# Patient Record
Sex: Female | Born: 1980 | Race: White | Hispanic: No | Smoking: Never smoker
Health system: Southern US, Community
[De-identification: ages and names within clinical notes are randomized; demographics above are authoritative.]

## PROBLEM LIST (undated history)

## (undated) HISTORY — PX: OTHER SURGICAL HISTORY: SHX169

---

## 2021-04-30 ENCOUNTER — Other Ambulatory Visit (HOSPITAL_BASED_OUTPATIENT_CLINIC_OR_DEPARTMENT_OTHER): Payer: Self-pay

## 2021-04-30 ENCOUNTER — Other Ambulatory Visit: Payer: Self-pay

## 2021-04-30 ENCOUNTER — Emergency Department (HOSPITAL_BASED_OUTPATIENT_CLINIC_OR_DEPARTMENT_OTHER)
Admission: EM | Admit: 2021-04-30 | Discharge: 2021-04-30 | Disposition: A | Discharge status: Home / Self Care | Payer: 59 | Attending: Emergency Medicine | Admitting: Emergency Medicine

## 2021-04-30 ENCOUNTER — Emergency Department (HOSPITAL_BASED_OUTPATIENT_CLINIC_OR_DEPARTMENT_OTHER): Payer: 59

## 2021-04-30 ENCOUNTER — Encounter (HOSPITAL_BASED_OUTPATIENT_CLINIC_OR_DEPARTMENT_OTHER): Payer: Self-pay | Admitting: Emergency Medicine

## 2021-04-30 DIAGNOSIS — R1032 Left lower quadrant pain: Secondary | ICD-10-CM | POA: Diagnosis present

## 2021-04-30 DIAGNOSIS — R319 Hematuria, unspecified: Secondary | ICD-10-CM | POA: Diagnosis not present

## 2021-04-30 DIAGNOSIS — R110 Nausea: Secondary | ICD-10-CM | POA: Diagnosis not present

## 2021-04-30 DIAGNOSIS — R109 Unspecified abdominal pain: Secondary | ICD-10-CM

## 2021-04-30 DIAGNOSIS — M549 Dorsalgia, unspecified: Secondary | ICD-10-CM | POA: Insufficient documentation

## 2021-04-30 LAB — URINALYSIS, ROUTINE W REFLEX MICROSCOPIC
Bilirubin Urine: NEGATIVE
Glucose, UA: NEGATIVE mg/dL
Hgb urine dipstick: NEGATIVE
Ketones, ur: NEGATIVE mg/dL
Leukocytes,Ua: NEGATIVE
Nitrite: NEGATIVE
Protein, ur: NEGATIVE mg/dL
Specific Gravity, Urine: 1.025 (ref 1.005–1.030)
pH: 6 (ref 5.0–8.0)

## 2021-04-30 LAB — CBC WITH DIFFERENTIAL/PLATELET
Abs Immature Granulocytes: 0.03 10*3/uL (ref 0.00–0.07)
Basophils Absolute: 0 10*3/uL (ref 0.0–0.1)
Basophils Relative: 0 %
Eosinophils Absolute: 0.2 10*3/uL (ref 0.0–0.5)
Eosinophils Relative: 3 %
HCT: 42.8 % (ref 36.0–46.0)
Hemoglobin: 14.3 g/dL (ref 12.0–15.0)
Immature Granulocytes: 0 %
Lymphocytes Relative: 38 %
Lymphs Abs: 2.8 10*3/uL (ref 0.7–4.0)
MCH: 32.7 pg (ref 26.0–34.0)
MCHC: 33.4 g/dL (ref 30.0–36.0)
MCV: 97.9 fL (ref 80.0–100.0)
Monocytes Absolute: 0.7 10*3/uL (ref 0.1–1.0)
Monocytes Relative: 10 %
Neutro Abs: 3.6 10*3/uL (ref 1.7–7.7)
Neutrophils Relative %: 49 %
Platelets: 321 10*3/uL (ref 150–400)
RBC: 4.37 MIL/uL (ref 3.87–5.11)
RDW: 12.2 % (ref 11.5–15.5)
WBC: 7.4 10*3/uL (ref 4.0–10.5)
nRBC: 0 % (ref 0.0–0.2)

## 2021-04-30 LAB — COMPREHENSIVE METABOLIC PANEL
ALT: 17 U/L (ref 0–44)
AST: 18 U/L (ref 15–41)
Albumin: 3.8 g/dL (ref 3.5–5.0)
Alkaline Phosphatase: 36 U/L — ABNORMAL LOW (ref 38–126)
Anion gap: 8 (ref 5–15)
BUN: 14 mg/dL (ref 6–20)
CO2: 24 mmol/L (ref 22–32)
Calcium: 8.6 mg/dL — ABNORMAL LOW (ref 8.9–10.3)
Chloride: 105 mmol/L (ref 98–111)
Creatinine, Ser: 0.66 mg/dL (ref 0.44–1.00)
GFR, Estimated: >60 mL/min (ref >60)
Glucose, Bld: 88 mg/dL (ref 70–99)
Potassium: 3.8 mmol/L (ref 3.5–5.1)
Sodium: 137 mmol/L (ref 135–145)
Total Bilirubin: 0.4 mg/dL (ref 0.3–1.2)
Total Protein: 6.4 g/dL — ABNORMAL LOW (ref 6.5–8.1)

## 2021-04-30 LAB — PREGNANCY, URINE: Preg Test, Ur: NEGATIVE

## 2021-04-30 LAB — TROPONIN I (HIGH SENSITIVITY): Troponin I (High Sensitivity): <2 ng/L (ref <18)

## 2021-04-30 LAB — LIPASE, BLOOD: Lipase: 27 U/L (ref 11–51)

## 2021-04-30 MED ORDER — MORPHINE SULFATE (PF) 4 MG/ML IV SOLN
4.0000 mg | Freq: Once | INTRAVENOUS | Status: AC
  Administered 2021-04-30: 4 mg via INTRAVENOUS
  Filled 2021-04-30: qty 1

## 2021-04-30 MED ORDER — CYCLOBENZAPRINE HCL 10 MG PO TABS
10.0000 mg | ORAL_TABLET | Freq: Two times a day (BID) | ORAL | 0 refills | Status: AC | PRN
  Filled 2021-04-30: qty 20, 10d supply, fill #0

## 2021-04-30 MED ORDER — ONDANSETRON HCL 4 MG/2ML IJ SOLN
4.0000 mg | Freq: Once | INTRAMUSCULAR | Status: AC
  Administered 2021-04-30: 4 mg via INTRAVENOUS
  Filled 2021-04-30: qty 2

## 2021-04-30 MED ORDER — KETOROLAC TROMETHAMINE 30 MG/ML IJ SOLN
30.0000 mg | Freq: Once | INTRAMUSCULAR | Status: AC
  Administered 2021-04-30: 30 mg via INTRAVENOUS
  Filled 2021-04-30: qty 1

## 2021-04-30 MED ORDER — SODIUM CHLORIDE 0.9 % IV BOLUS
1000.0000 mL | Freq: Once | INTRAVENOUS | Status: AC
  Administered 2021-04-30: 1000 mL via INTRAVENOUS

## 2021-04-30 NOTE — ED Provider Notes (Signed)
[facility] EMERGENCY DEPARTMENT Provider Note   CSN: [id number] Arrival date & time: 04/30/21  0745     History No chief complaint on file.   Kristy Kelly is a 40 y.o. female.  HPI     40yo female presents with concern for left flank pain.  She has a history of multiple surgeries including most recnetly surgery to remove nerve stimulator from her back. She is visiting from [place] for work. On Sunday, she began to have some left flank pain and it has worsened over the last few days. Pain is severe, waxing and waning and worsening over last few days. Does feel like past kidney stones. Located left flank and some into LLQ.   Had vomiting Saturday night, otherwise has had nausea and no vomiting. Noted some blood after urinating, no other vaginal bleeding or discharge. No constipation or diarrhea. No fevers or chills.    History reviewed. No pertinent past medical history.  There are no problems to display for this patient.   Past Surgical History:  Procedure Laterality Date  . vaginal mesh       OB History   No obstetric history on file.     History reviewed. No pertinent family history.  Social History   Tobacco Use  . Smoking status: Never Smoker  . Smokeless tobacco: Never Used  Vaping Use  . Vaping Use: Never used  Substance Use Topics  . Alcohol use: Not Currently  . Drug use: Not Currently    Home Medications Prior to Admission medications   Medication Sig Start Date End Date Taking? Authorizing Provider  amphetamine-dextroamphetamine (ADDERALL XR) 30 MG 24 hr capsule Take 30 mg by mouth daily.   Yes [provider]  amphetamine-dextroamphetamine (ADDERALL) 20 MG tablet Take 20 mg by mouth daily.   Yes [provider]  cetirizine (ZYRTEC) 10 MG tablet Take 10 mg by mouth at bedtime.   Yes [provider]  fexofenadine (ALLEGRA) 180 MG tablet Take 180 mg by mouth daily.   Yes [provider]    Allergies     Penicillins  Review of Systems   Review of Systems  Constitutional: Negative for appetite change, chills and fever.  Respiratory: Negative for cough.   Cardiovascular: Negative for chest pain.  Gastrointestinal: Positive for abdominal pain and nausea. Negative for constipation, diarrhea and vomiting (on saturday).  Genitourinary: Positive for hematuria. Negative for dysuria, vaginal bleeding and vaginal discharge.  Musculoskeletal: Positive for back pain.  Skin: Negative for rash.    Physical Exam Updated Vital Signs BP 122/77   Pulse 76   Temp 98.1 F (36.7 C) (Oral)   Resp 18   Ht 5\' 7"  (1.702 m)   Wt 74.8 kg   SpO2 98%   BMI 25.84 kg/m   Physical Exam Vitals and nursing note reviewed.  Constitutional:      General: She is in acute distress (appears uncomfortable).     Appearance: She is well-developed. She is not diaphoretic.  HENT:     Head: Normocephalic and atraumatic.  Eyes:     Conjunctiva/sclera: Conjunctivae normal.  Cardiovascular:     Rate and Rhythm: Normal rate and regular rhythm.     Heart sounds: Normal heart sounds. No murmur heard. No friction rub. No gallop.   Pulmonary:     Effort: Pulmonary effort is normal. No respiratory distress.     Breath sounds: Normal breath sounds. No wheezing or rales.  Abdominal:     General: There  is no distension.     Palpations: Abdomen is soft.     Tenderness: There is abdominal tenderness (LLQ). There is left CVA tenderness. There is no guarding.  Musculoskeletal:        General: Tenderness (left side) present.     Cervical back: Normal range of motion.  Skin:    General: Skin is warm and dry.     Findings: No erythema or rash.  Neurological:     Mental Status: She is alert and oriented to person, place, and time.     Comments: Normal strength and sensation bilateral LE      ED Results / Procedures / Treatments   Labs (all labs ordered are listed, but only abnormal results are displayed) Labs Reviewed   COMPREHENSIVE METABOLIC PANEL - Abnormal; Notable for the following components:      Result Value   Calcium 8.6 (*)    Total Protein 6.4 (*)    Alkaline Phosphatase 36 (*)    All other components within normal limits  URINE CULTURE  CBC WITH DIFFERENTIAL/PLATELET  URINALYSIS, ROUTINE W REFLEX MICROSCOPIC  PREGNANCY, URINE  LIPASE, BLOOD    EKG None  Radiology CT Renal Stone Study  Result Date: 04/30/2021 CLINICAL DATA:  LEFT flank pain since Sunday, history of kidney stones EXAM: CT ABDOMEN AND PELVIS WITHOUT CONTRAST TECHNIQUE: Multidetector CT imaging of the abdomen and pelvis was performed following the standard protocol without IV contrast. Sagittal and coronal MPR images reconstructed from axial data set. No oral contrast was administered for this indication. COMPARISON:  None FINDINGS: Lower chest: Lung bases clear Hepatobiliary: Gallbladder and liver normal appearance Pancreas: Normal appearance Spleen: Normal appearance Adrenals/Urinary Tract: Adrenal glands, kidneys, ureters, and bladder normal appearance. No urinary tract calcification or dilatation. Stomach/Bowel: Normal appendix. Few tiny radiopacities are seen within small bowel loops in pelvis consistent within ingested radiopaque foreign bodies. Stomach and bowel loops otherwise normal appearance. No bowel wall thickening, dilatation, or inflammatory process seen. Vascular/Lymphatic: Few pelvic phleboliths. Aorta normal caliber. Scattered normal size mesenteric lymph nodes. No adenopathy. Reproductive: Uterus surgically absent with nonvisualization of ovaries Other: No free air or free fluid. No hernia or inflammatory process. Musculoskeletal: Unremarkable IMPRESSION: No acute intra-abdominal or intrapelvic abnormalities. Specifically, no evidence of urinary tract calcification or dilatation. Electronically Signed   By: Mark  Boles M.D.   On: 04/30/2021 09:04    Procedures Procedures   Medications Ordered in ED Medications   sodium chloride 0.9 % bolus 1,000 mL (1,000 mLs Intravenous New Bag/Given 04/30/21 0816)  ketorolac (TORADOL) 30 MG/ML injection 30 mg (30 mg Intravenous Given 04/30/21 0816)  ondansetron (ZOFRAN) injection 4 mg (4 mg Intravenous Given 04/30/21 0816)  morphine 4 MG/ML injection 4 mg (4 mg Intravenous Given 04/30/21 0909)    ED Course  I have reviewed the triage vital signs and the nursing notes.  Pertinent labs & imaging results that were available during my care of the patient were reviewed by me and considered in my medical decision making (see chart for details).    MDM Rules/Calculators/A&P                          40 yo female with hx of abdominal surgeries and nephrolithiasis presents with concern for abdominal pain.    DDx includes appendicitis, pancreatitis, cholecystitis, pyelonephritis, nephrolithiasis, diverticulitis, PID, ovarian torsion, ectopic pregnancy, and tuboovarian abscess.  No sign of pancreatitis, hepatitis on labwork.   Pregnancy test negative.  Urinalysis without  signs of infection, no hematuria, no signs of pyelonephritis.  CT stone study shows no acute intraabdominal or pelvic abnormalities. She reports both hysterectomy and oophorectomy (cannot have PID/torsion).  No red flags of back pain, has a normal neurologic exam and denies any urinary retention or overflow incontinence, stool incontinence, saddle anesthesia, fever, IV drug use, and have low suspicion suspicion for cauda equina, fracture, epidural abscess, or vertebral osteomyelitis.  Normal pulses bilaterally, no risk factors for dissection and doubt this as etiology.  No risk factors for renal infarct/ (no hx of IVDU, she is not sure if afib history--sinus rhythm noted on ECG today) Do not suspect vascular abnormality or feel that repeat imaging indicated at this time.  Denies chest pain or dyspnea, clinically low suspicion for ACS. ECG with ST depression inferior leads, slight elevation laterally, no prior to  comparison-sent troponin which is negative, given duration of flank pain doubt ACS.  Given IV fluids, zofran and toradol and morphine. Unclear etiology of pain today, possible muscular pain. Will give rx for flexeril. Patient discharged in stable condition with understanding of reasons to return.     Final Clinical Impression(s) / ED Diagnoses Final diagnoses:  Left flank pain    Rx / DC Orders ED Discharge Orders    None       Alvira Monday, MD 04/30/21 1029

## 2021-04-30 NOTE — ED Notes (Signed)
Requested laboratory to add troponin level. Lab agreeable.

## 2021-04-30 NOTE — ED Triage Notes (Signed)
Left flank pain since Sunday. History of kidney stones. No burning with urination.

## 2021-04-30 NOTE — ED Notes (Signed)
MD notified. New orders received.

## 2021-05-01 LAB — URINE CULTURE: Culture: <10000 — AB

## 2022-05-26 IMAGING — CT CT RENAL STONE PROTOCOL
2 of 4 series · 16 of 46 positions shown, 18 images · non-contrast
Comparison: None

CLINICAL DATA: LEFT flank pain since [REDACTED], history of kidney
stones

EXAM:
CT ABDOMEN AND PELVIS WITHOUT CONTRAST
TECHNIQUE: Multidetector CT imaging of the abdomen and pelvis was performed
following the standard protocol without IV contrast. Sagittal and
coronal MPR images reconstructed from axial data set. No oral
contrast was administered for this indication.

[Series 2: axial st · axial · 0.73mm/px · z∈[-667,-252]mm · 13 of 91 slices shown, 15 images]
[im 4/91  soft-tissue]
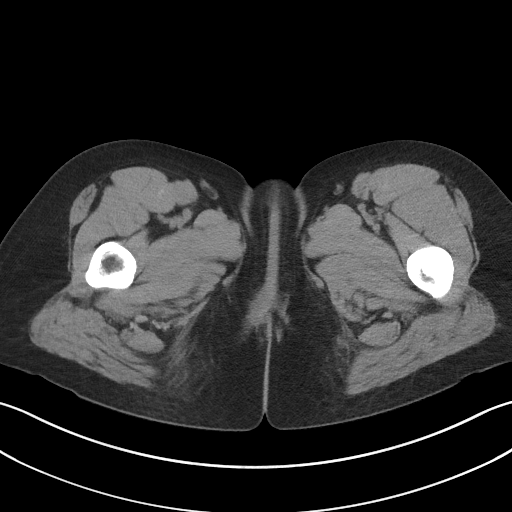
[im 4/91  bone]
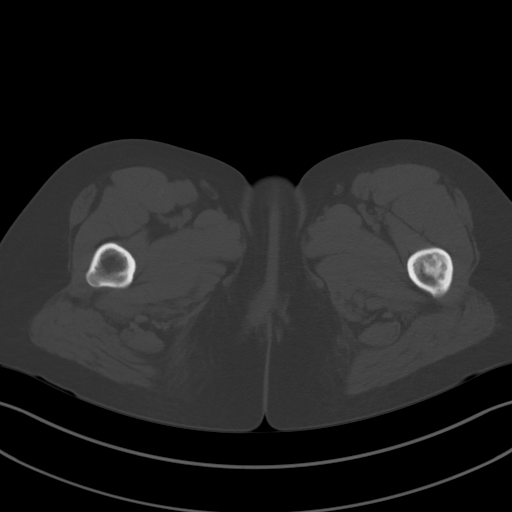
[im 11/91  soft-tissue]
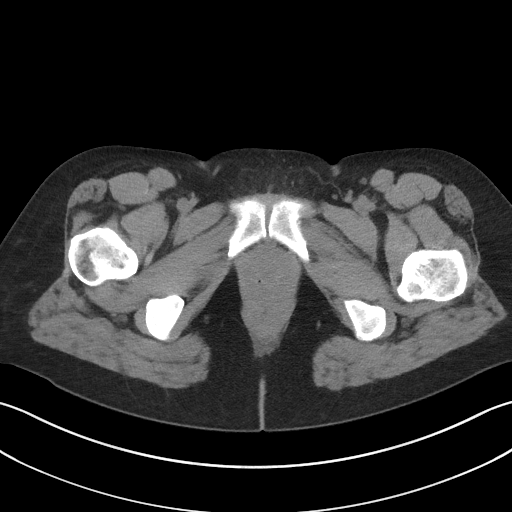
[im 19/91  soft-tissue]
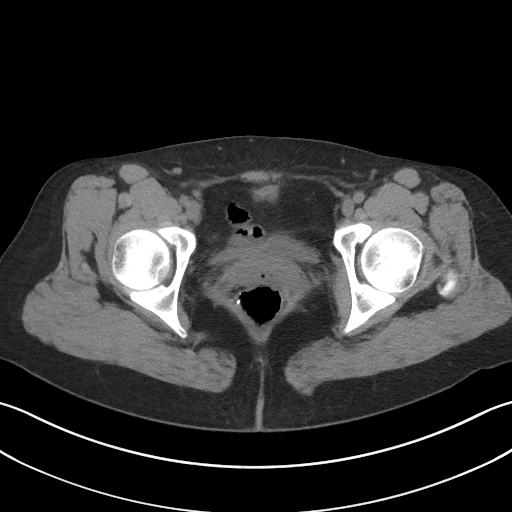
[im 26/91  soft-tissue]
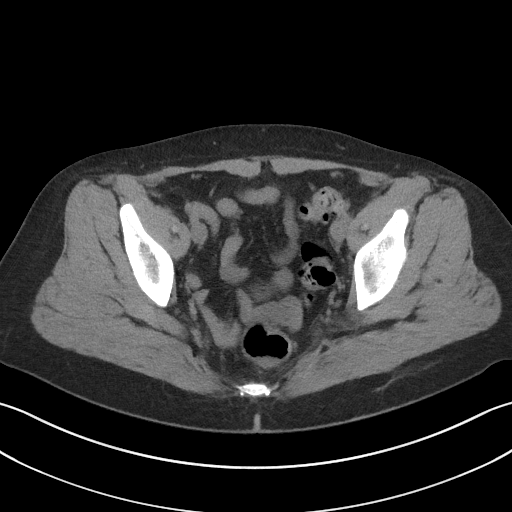
[im 33/91  soft-tissue]
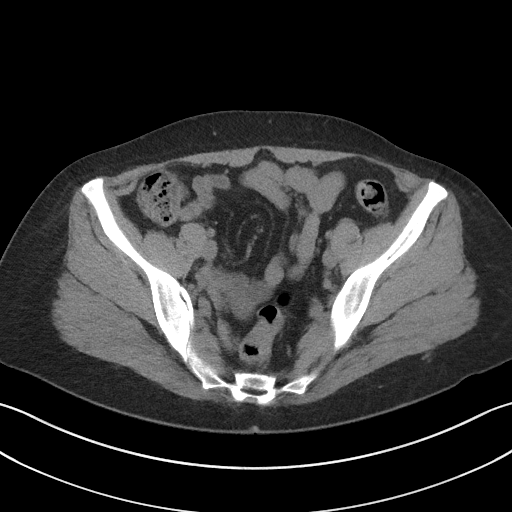
[im 40/91  soft-tissue]
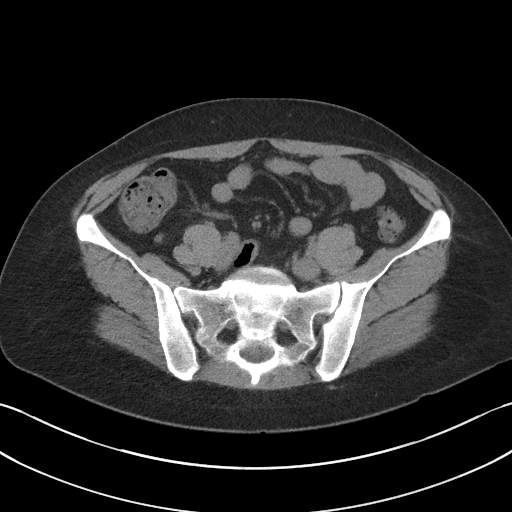
[im 47/91  soft-tissue]
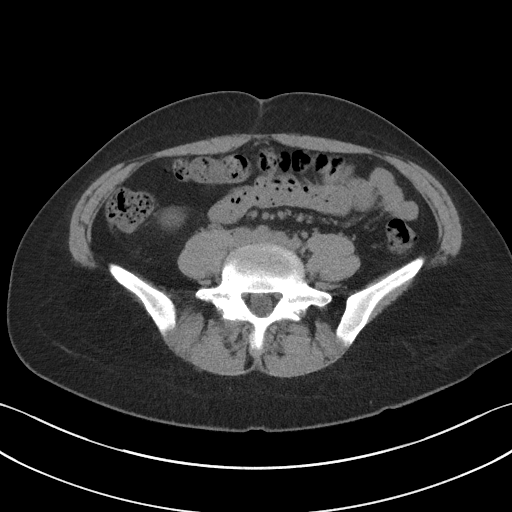
[im 51/91  soft-tissue]
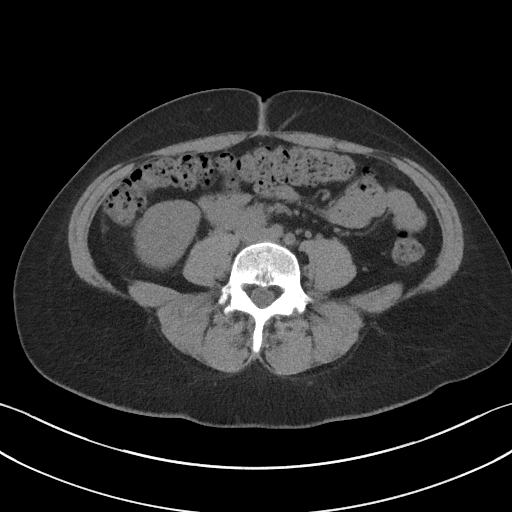
[im 58/91  soft-tissue]
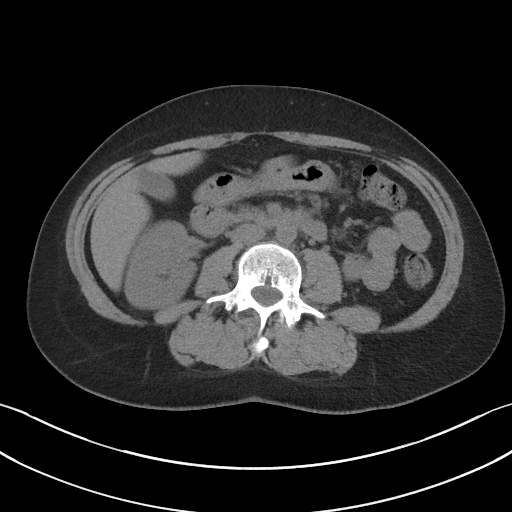
[im 58/91  bone]
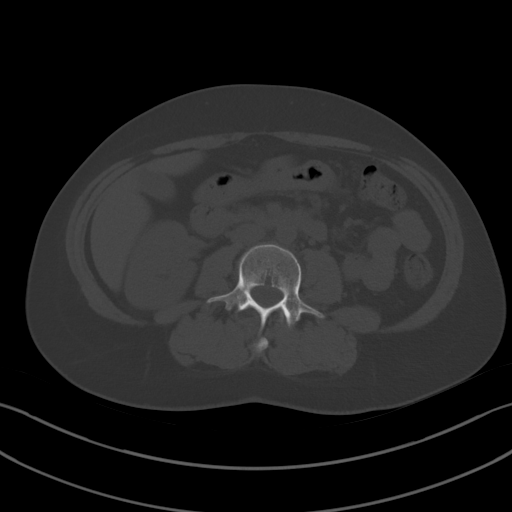
[im 65/91  soft-tissue]
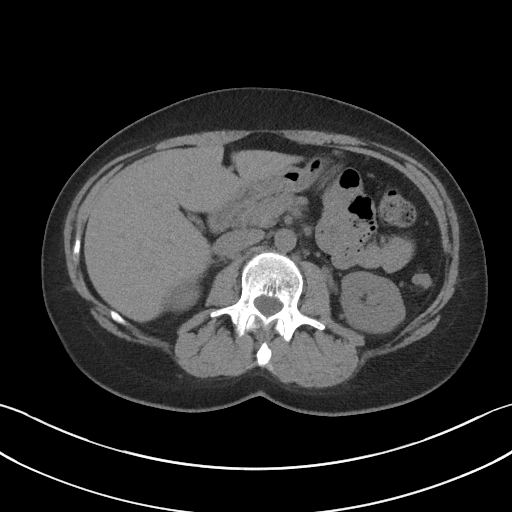
[im 73/91  soft-tissue]
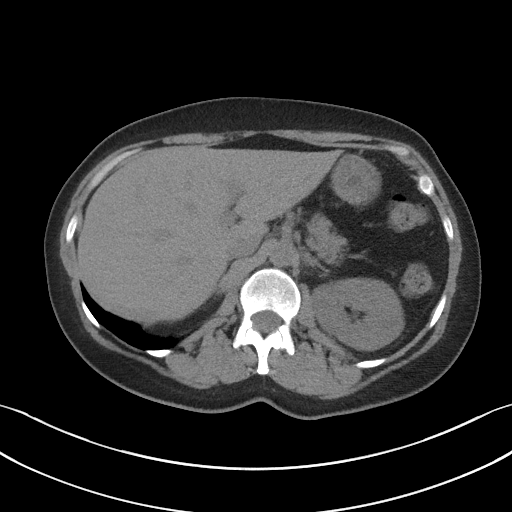
[im 80/91  soft-tissue]
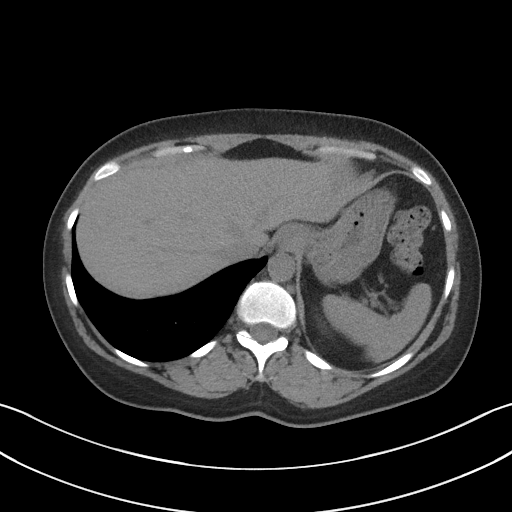
[im 87/91  soft-tissue]
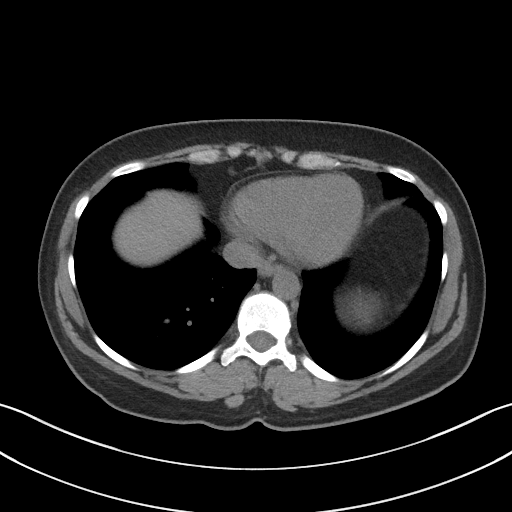

[Series 5: coronal st · coronal · 0.77mm/px · 3 of 96 slices shown]
[im 32/96  soft-tissue]
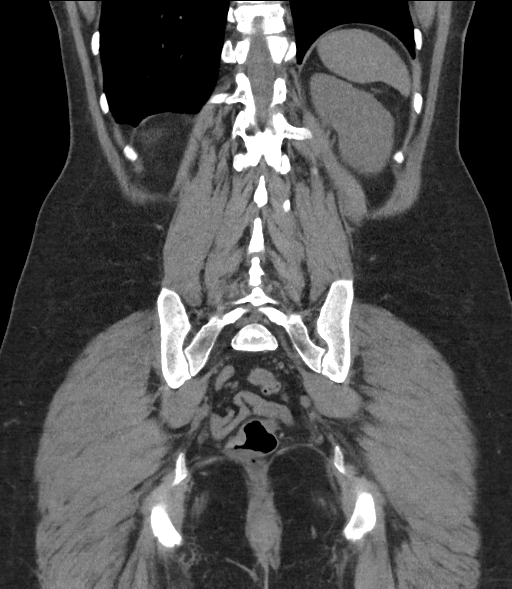
[im 43/96  soft-tissue]
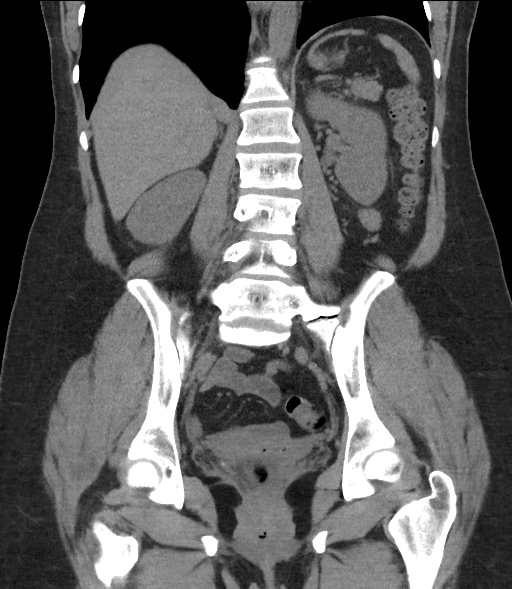
[im 53/96  soft-tissue]
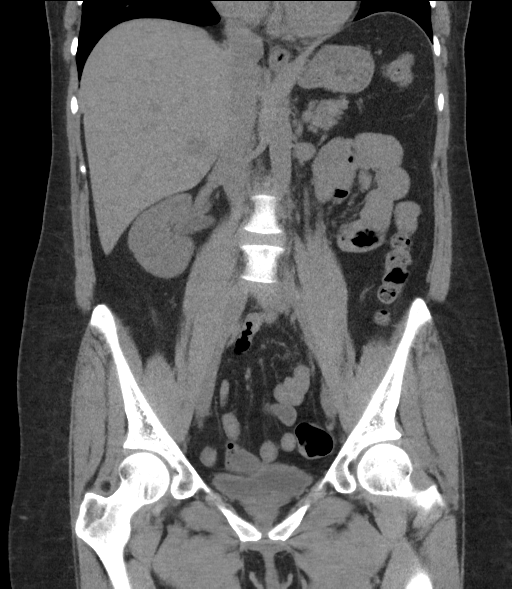

[16 of 46 positions shown; findings below may reference images not displayed]

FINDINGS: Lower chest: Lung bases clear

Hepatobiliary: Gallbladder and liver normal appearance

Pancreas: Normal appearance

Spleen: Normal appearance

Adrenals/Urinary Tract: Adrenal glands, kidneys, ureters, and
bladder normal appearance. No urinary tract calcification or
dilatation.

Stomach/Bowel: Normal appendix. Few tiny radiopacities are seen
within small bowel loops in pelvis consistent within ingested
radiopaque foreign bodies. Stomach and bowel loops otherwise normal
appearance. No bowel wall thickening, dilatation, or inflammatory
process seen.

Vascular/Lymphatic: Few pelvic phleboliths. Aorta normal caliber.
Scattered normal size mesenteric lymph nodes. No adenopathy.

Reproductive: Uterus surgically absent with nonvisualization of
ovaries

Other: No free air or free fluid. No hernia or inflammatory process.

Musculoskeletal: Unremarkable
IMPRESSION: No acute intra-abdominal or intrapelvic abnormalities.

Specifically, no evidence of urinary tract calcification or
dilatation.
